# Patient Record
Sex: Male | Born: 1965 | Race: White | Hispanic: No | Marital: Married | Smoking: Current every day smoker
Health system: Southern US, Community
[De-identification: ages and names within clinical notes are randomized; demographics above are authoritative.]

## PROBLEM LIST (undated history)

## (undated) DIAGNOSIS — R0600 Dyspnea, unspecified: Secondary | ICD-10-CM

## (undated) DIAGNOSIS — R609 Edema, unspecified: Secondary | ICD-10-CM

## (undated) DIAGNOSIS — J449 Chronic obstructive pulmonary disease, unspecified: Secondary | ICD-10-CM

## (undated) DIAGNOSIS — I10 Essential (primary) hypertension: Secondary | ICD-10-CM

## (undated) DIAGNOSIS — E669 Obesity, unspecified: Secondary | ICD-10-CM

## (undated) DIAGNOSIS — I499 Cardiac arrhythmia, unspecified: Secondary | ICD-10-CM

## (undated) DIAGNOSIS — R0789 Other chest pain: Secondary | ICD-10-CM

## (undated) DIAGNOSIS — K219 Gastro-esophageal reflux disease without esophagitis: Secondary | ICD-10-CM

## (undated) DIAGNOSIS — R000 Tachycardia, unspecified: Secondary | ICD-10-CM

## (undated) DIAGNOSIS — R0601 Orthopnea: Secondary | ICD-10-CM

## (undated) DIAGNOSIS — G4730 Sleep apnea, unspecified: Secondary | ICD-10-CM

## (undated) DIAGNOSIS — R0602 Shortness of breath: Secondary | ICD-10-CM

## (undated) HISTORY — DX: Edema, unspecified: R60.9

## (undated) HISTORY — DX: Gastro-esophageal reflux disease without esophagitis: K21.9

## (undated) HISTORY — DX: Cardiac arrhythmia, unspecified: I49.9

## (undated) HISTORY — DX: Orthopnea: R06.01

## (undated) HISTORY — DX: Other chest pain: R07.89

## (undated) HISTORY — DX: Chronic obstructive pulmonary disease, unspecified: J44.9

## (undated) HISTORY — DX: Dyspnea, unspecified: R06.00

## (undated) HISTORY — DX: Tachycardia, unspecified: R00.0

## (undated) HISTORY — DX: Essential (primary) hypertension: I10

## (undated) HISTORY — DX: Shortness of breath: R06.02

## (undated) HISTORY — DX: Sleep apnea, unspecified: G47.30

## (undated) HISTORY — DX: Obesity, unspecified: E66.9

---

## 2006-04-15 ENCOUNTER — Inpatient Hospital Stay: Payer: Self-pay | Admitting: Otolaryngology

## 2007-05-10 ENCOUNTER — Emergency Department: Payer: Self-pay | Admitting: Emergency Medicine

## 2007-08-07 ENCOUNTER — Emergency Department: Payer: Self-pay | Admitting: Emergency Medicine

## 2011-04-26 ENCOUNTER — Observation Stay: Payer: Self-pay | Admitting: Internal Medicine

## 2011-04-26 DIAGNOSIS — I059 Rheumatic mitral valve disease, unspecified: Secondary | ICD-10-CM

## 2011-04-26 DIAGNOSIS — R0602 Shortness of breath: Secondary | ICD-10-CM

## 2011-04-26 LAB — COMPREHENSIVE METABOLIC PANEL
Anion Gap: 11 (ref 7–16)
Bilirubin,Total: 0.4 mg/dL (ref 0.2–1.0)
Chloride: 105 mmol/L (ref 98–107)
Co2: 26 mmol/L (ref 21–32)
Creatinine: 0.99 mg/dL (ref 0.60–1.30)
EGFR (African American): >60
EGFR (Non-African Amer.): >60
Potassium: 3.7 mmol/L (ref 3.5–5.1)
SGPT (ALT): 41 U/L

## 2011-04-26 LAB — CBC WITH DIFFERENTIAL/PLATELET
Basophil #: 0 10*3/uL (ref 0.0–0.1)
Eosinophil %: 1.3 %
MCV: 91 fL (ref 80–100)
Monocyte %: 7.8 %
Neutrophil %: 65 %
Platelet: 164 10*3/uL (ref 150–440)
RBC: 5.97 10*6/uL — ABNORMAL HIGH (ref 4.40–5.90)
RDW: 16.6 % — ABNORMAL HIGH (ref 11.5–14.5)
WBC: 12 10*3/uL — ABNORMAL HIGH (ref 3.8–10.6)

## 2011-04-26 LAB — CK TOTAL AND CKMB (NOT AT ARMC): CK-MB: 0.9 ng/mL (ref 0.5–3.6)

## 2011-04-26 LAB — DRUG SCREEN, URINE
Amphetamines, Ur Screen: NEGATIVE (ref ?–1000)
Cannabinoid 50 Ng, Ur ~~LOC~~: NEGATIVE (ref ?–50)
Methadone, Ur Screen: NEGATIVE (ref ?–300)
Opiate, Ur Screen: POSITIVE (ref ?–300)
Phencyclidine (PCP) Ur S: NEGATIVE (ref ?–25)

## 2011-04-26 LAB — TROPONIN I
Troponin-I: <0.02 ng/mL
Troponin-I: <0.02 ng/mL

## 2011-04-26 LAB — TSH: Thyroid Stimulating Horm: 2.49 u[IU]/mL

## 2011-04-26 LAB — PRO B NATRIURETIC PEPTIDE: B-Type Natriuretic Peptide: 262 pg/mL — ABNORMAL HIGH (ref 0–125)

## 2011-04-27 DIAGNOSIS — I4891 Unspecified atrial fibrillation: Secondary | ICD-10-CM

## 2011-04-27 LAB — BASIC METABOLIC PANEL
Anion Gap: 14 (ref 7–16)
BUN: 18 mg/dL (ref 7–18)
Calcium, Total: 8.9 mg/dL (ref 8.5–10.1)
Chloride: 95 mmol/L — ABNORMAL LOW (ref 98–107)
EGFR (African American): >60
EGFR (Non-African Amer.): >60
Glucose: 268 mg/dL — ABNORMAL HIGH (ref 65–99)
Osmolality: 283 (ref 275–301)
Potassium: 5 mmol/L (ref 3.5–5.1)

## 2011-04-27 LAB — CBC WITH DIFFERENTIAL/PLATELET
Basophil %: 0 %
Eosinophil %: 0.1 %
HCT: 53 % — ABNORMAL HIGH (ref 40.0–52.0)
HGB: 17.4 g/dL (ref 13.0–18.0)
Lymphocyte #: 1.4 10*3/uL (ref 1.0–3.6)
Monocyte %: 1.4 %
Neutrophil #: 12.3 10*3/uL — ABNORMAL HIGH (ref 1.4–6.5)
Neutrophil %: 88.7 %
Platelet: 142 10*3/uL — ABNORMAL LOW (ref 150–440)
RBC: 5.81 10*6/uL (ref 4.40–5.90)
RDW: 16.6 % — ABNORMAL HIGH (ref 11.5–14.5)
WBC: 13.9 10*3/uL — ABNORMAL HIGH (ref 3.8–10.6)

## 2011-04-27 LAB — CK TOTAL AND CKMB (NOT AT ARMC)
CK, Total: 62 U/L (ref 35–232)
CK-MB: 0.9 ng/mL (ref 0.5–3.6)

## 2011-04-27 LAB — LIPID PANEL
Cholesterol: 135 mg/dL (ref 0–200)
HDL Cholesterol: 52 mg/dL (ref 40–60)
Ldl Cholesterol, Calc: 71 mg/dL (ref 0–100)
VLDL Cholesterol, Calc: 12 mg/dL (ref 5–40)

## 2011-04-27 LAB — MAGNESIUM: Magnesium: 1.6 mg/dL — ABNORMAL LOW

## 2011-04-27 LAB — TROPONIN I: Troponin-I: <0.02 ng/mL

## 2011-05-18 ENCOUNTER — Encounter: Payer: Self-pay | Admitting: *Deleted

## 2011-05-18 ENCOUNTER — Encounter: Payer: Self-pay | Admitting: Cardiovascular Disease

## 2011-05-26 ENCOUNTER — Encounter: Payer: Self-pay | Admitting: Cardiovascular Disease

## 2013-12-01 ENCOUNTER — Other Ambulatory Visit: Payer: Self-pay | Admitting: Physician Assistant

## 2013-12-01 ENCOUNTER — Inpatient Hospital Stay: Payer: Self-pay | Admitting: Internal Medicine

## 2013-12-01 DIAGNOSIS — I4891 Unspecified atrial fibrillation: Secondary | ICD-10-CM

## 2013-12-01 DIAGNOSIS — N179 Acute kidney failure, unspecified: Secondary | ICD-10-CM

## 2013-12-01 DIAGNOSIS — R7989 Other specified abnormal findings of blood chemistry: Secondary | ICD-10-CM

## 2013-12-01 DIAGNOSIS — J9601 Acute respiratory failure with hypoxia: Secondary | ICD-10-CM

## 2013-12-01 DIAGNOSIS — I517 Cardiomegaly: Secondary | ICD-10-CM

## 2013-12-01 LAB — COMPREHENSIVE METABOLIC PANEL
ALK PHOS: 67 U/L
ALT: 651 U/L — AB
ANION GAP: 8 (ref 7–16)
Albumin: 3 g/dL — ABNORMAL LOW (ref 3.4–5.0)
BUN: 53 mg/dL — ABNORMAL HIGH (ref 7–18)
Bilirubin,Total: 0.5 mg/dL (ref 0.2–1.0)
CHLORIDE: 87 mmol/L — AB (ref 98–107)
Calcium, Total: 8.4 mg/dL — ABNORMAL LOW (ref 8.5–10.1)
Co2: 32 mmol/L (ref 21–32)
Creatinine: 2.7 mg/dL — ABNORMAL HIGH (ref 0.60–1.30)
EGFR (Non-African Amer.): 27 — ABNORMAL LOW
GFR CALC AF AMER: 32 — AB
GLUCOSE: 197 mg/dL — AB (ref 65–99)
Osmolality: 275 (ref 275–301)
Potassium: 5.9 mmol/L — ABNORMAL HIGH (ref 3.5–5.1)
SGOT(AST): 947 U/L — ABNORMAL HIGH (ref 15–37)
SODIUM: 127 mmol/L — AB (ref 136–145)
TOTAL PROTEIN: 7.2 g/dL (ref 6.4–8.2)

## 2013-12-01 LAB — CBC
HCT: 46.9 % (ref 40.0–52.0)
HGB: 14.7 g/dL (ref 13.0–18.0)
MCH: 27.1 pg (ref 26.0–34.0)
MCHC: 31.3 g/dL — ABNORMAL LOW (ref 32.0–36.0)
MCV: 87 fL (ref 80–100)
PLATELETS: 129 10*3/uL — AB (ref 150–440)
RBC: 5.41 10*6/uL (ref 4.40–5.90)
RDW: 17.8 % — ABNORMAL HIGH (ref 11.5–14.5)
WBC: 10.6 10*3/uL (ref 3.8–10.6)

## 2013-12-01 LAB — PROTIME-INR
INR: 5.3 — AB
Prothrombin Time: 46.9 secs — ABNORMAL HIGH (ref 11.5–14.7)

## 2013-12-01 LAB — TROPONIN I: TROPONIN-I: 0.22 ng/mL — AB

## 2013-12-01 LAB — CK TOTAL AND CKMB (NOT AT ARMC)
CK, Total: 461 U/L — ABNORMAL HIGH
CK-MB: 4.1 ng/mL — AB (ref 0.5–3.6)

## 2013-12-01 LAB — PRO B NATRIURETIC PEPTIDE: B-TYPE NATIURETIC PEPTID: 8433 pg/mL — AB (ref 0–125)

## 2013-12-02 LAB — CBC WITH DIFFERENTIAL/PLATELET
Basophil #: 0 10*3/uL (ref 0.0–0.1)
Basophil %: 0.1 %
EOS ABS: 0 10*3/uL (ref 0.0–0.7)
Eosinophil %: 0 %
HCT: 44.1 % (ref 40.0–52.0)
HGB: 14 g/dL (ref 13.0–18.0)
LYMPHS ABS: 0.6 10*3/uL — AB (ref 1.0–3.6)
Lymphocyte %: 6.2 %
MCH: 27.2 pg (ref 26.0–34.0)
MCHC: 31.6 g/dL — ABNORMAL LOW (ref 32.0–36.0)
MCV: 86 fL (ref 80–100)
MONO ABS: 0.6 x10 3/mm (ref 0.2–1.0)
Monocyte %: 6 %
NEUTROS PCT: 87.7 %
Neutrophil #: 9.2 10*3/uL — ABNORMAL HIGH (ref 1.4–6.5)
Platelet: 116 10*3/uL — ABNORMAL LOW (ref 150–440)
RBC: 5.13 10*6/uL (ref 4.40–5.90)
RDW: 17.7 % — ABNORMAL HIGH (ref 11.5–14.5)
WBC: 10.4 10*3/uL (ref 3.8–10.6)

## 2013-12-02 LAB — BASIC METABOLIC PANEL
ANION GAP: 7 (ref 7–16)
BUN: 60 mg/dL — AB (ref 7–18)
CALCIUM: 8.4 mg/dL — AB (ref 8.5–10.1)
CHLORIDE: 89 mmol/L — AB (ref 98–107)
CO2: 30 mmol/L (ref 21–32)
Creatinine: 2.18 mg/dL — ABNORMAL HIGH (ref 0.60–1.30)
EGFR (Non-African Amer.): 34 — ABNORMAL LOW
GFR CALC AF AMER: 41 — AB
Glucose: 359 mg/dL — ABNORMAL HIGH (ref 65–99)
OSMOLALITY: 285 (ref 275–301)
Potassium: 5.3 mmol/L — ABNORMAL HIGH (ref 3.5–5.1)
SODIUM: 126 mmol/L — AB (ref 136–145)

## 2013-12-02 LAB — HEPATIC FUNCTION PANEL A (ARMC)
ALBUMIN: 2.7 g/dL — AB (ref 3.4–5.0)
AST: 832 U/L — AB (ref 15–37)
Alkaline Phosphatase: 67 U/L
BILIRUBIN TOTAL: 0.4 mg/dL (ref 0.2–1.0)
Bilirubin, Direct: 0.2 mg/dL (ref 0.0–0.2)
SGPT (ALT): 856 U/L — ABNORMAL HIGH
Total Protein: 6.7 g/dL (ref 6.4–8.2)

## 2013-12-02 LAB — TROPONIN I
Troponin-I: 0.22 ng/mL — ABNORMAL HIGH
Troponin-I: 0.18 ng/mL — ABNORMAL HIGH

## 2013-12-02 LAB — CK-MB
CK-MB: 3.1 ng/mL (ref 0.5–3.6)
CK-MB: 2.2 ng/mL (ref 0.5–3.6)

## 2013-12-02 LAB — SODIUM: Sodium: 126 mmol/L — ABNORMAL LOW (ref 136–145)

## 2013-12-03 LAB — BASIC METABOLIC PANEL
Anion Gap: 10 (ref 7–16)
BUN: 67 mg/dL — AB (ref 7–18)
CALCIUM: 8.8 mg/dL (ref 8.5–10.1)
CO2: 31 mmol/L (ref 21–32)
CREATININE: 2.15 mg/dL — AB (ref 0.60–1.30)
Chloride: 90 mmol/L — ABNORMAL LOW (ref 98–107)
EGFR (African American): 42 — ABNORMAL LOW
EGFR (Non-African Amer.): 35 — ABNORMAL LOW
Glucose: 328 mg/dL — ABNORMAL HIGH (ref 65–99)
Osmolality: 295 (ref 275–301)
POTASSIUM: 4.5 mmol/L (ref 3.5–5.1)
Sodium: 131 mmol/L — ABNORMAL LOW (ref 136–145)

## 2013-12-03 LAB — PROTIME-INR
INR: 3
Prothrombin Time: 30.2 secs — ABNORMAL HIGH (ref 11.5–14.7)

## 2013-12-03 LAB — HEPATIC FUNCTION PANEL A (ARMC)
ALT: 723 U/L — AB
Albumin: 2.8 g/dL — ABNORMAL LOW (ref 3.4–5.0)
Alkaline Phosphatase: 69 U/L
Bilirubin, Direct: 0.2 mg/dL (ref 0.0–0.2)
Bilirubin,Total: 0.5 mg/dL (ref 0.2–1.0)
SGOT(AST): 293 U/L — ABNORMAL HIGH (ref 15–37)
Total Protein: 6.8 g/dL (ref 6.4–8.2)

## 2013-12-03 LAB — ACETAMINOPHEN LEVEL

## 2013-12-03 LAB — SODIUM: Sodium: 130 mmol/L — ABNORMAL LOW (ref 136–145)

## 2013-12-04 DIAGNOSIS — I5031 Acute diastolic (congestive) heart failure: Secondary | ICD-10-CM

## 2013-12-04 LAB — BASIC METABOLIC PANEL
Anion Gap: 7 (ref 7–16)
BUN: 62 mg/dL — ABNORMAL HIGH (ref 7–18)
CREATININE: 2.08 mg/dL — AB (ref 0.60–1.30)
Calcium, Total: 9.4 mg/dL (ref 8.5–10.1)
Chloride: 95 mmol/L — ABNORMAL LOW (ref 98–107)
Co2: 32 mmol/L (ref 21–32)
EGFR (Non-African Amer.): 36 — ABNORMAL LOW
GFR CALC AF AMER: 44 — AB
GLUCOSE: 428 mg/dL — AB (ref 65–99)
OSMOLALITY: 304 (ref 275–301)
Potassium: 4.9 mmol/L (ref 3.5–5.1)
Sodium: 134 mmol/L — ABNORMAL LOW (ref 136–145)

## 2013-12-04 LAB — HEPATIC FUNCTION PANEL A (ARMC)
Albumin: 2.8 g/dL — ABNORMAL LOW (ref 3.4–5.0)
Alkaline Phosphatase: 72 U/L
Bilirubin, Direct: 0.2 mg/dL (ref 0.0–0.2)
Bilirubin,Total: 0.4 mg/dL (ref 0.2–1.0)
SGOT(AST): 199 U/L — ABNORMAL HIGH (ref 15–37)
SGPT (ALT): 717 U/L — ABNORMAL HIGH
TOTAL PROTEIN: 7 g/dL (ref 6.4–8.2)

## 2013-12-04 LAB — PROTIME-INR
INR: 2.6
Prothrombin Time: 26.9 secs — ABNORMAL HIGH (ref 11.5–14.7)

## 2013-12-05 LAB — COMPREHENSIVE METABOLIC PANEL
Albumin: 2.8 g/dL — ABNORMAL LOW (ref 3.4–5.0)
Alkaline Phosphatase: 73 U/L
Anion Gap: 8 (ref 7–16)
BUN: 75 mg/dL — ABNORMAL HIGH (ref 7–18)
Bilirubin,Total: 0.5 mg/dL (ref 0.2–1.0)
Calcium, Total: 9.2 mg/dL (ref 8.5–10.1)
Chloride: 96 mmol/L — ABNORMAL LOW (ref 98–107)
Co2: 36 mmol/L — ABNORMAL HIGH (ref 21–32)
Creatinine: 1.98 mg/dL — ABNORMAL HIGH (ref 0.60–1.30)
EGFR (African American): 46 — ABNORMAL LOW
EGFR (Non-African Amer.): 38 — ABNORMAL LOW
Glucose: 410 mg/dL — ABNORMAL HIGH (ref 65–99)
Osmolality: 319 (ref 275–301)
Potassium: 4.2 mmol/L (ref 3.5–5.1)
SGOT(AST): 122 U/L — ABNORMAL HIGH (ref 15–37)
SGPT (ALT): 606 U/L — ABNORMAL HIGH
Sodium: 140 mmol/L (ref 136–145)
Total Protein: 6.9 g/dL (ref 6.4–8.2)

## 2013-12-05 LAB — PROTIME-INR
INR: 2.7
PROTHROMBIN TIME: 27.9 s — AB (ref 11.5–14.7)

## 2013-12-06 LAB — CULTURE, BLOOD (SINGLE)

## 2014-02-16 DEATH — deceased

## 2014-06-09 NOTE — H&P (Signed)
PATIENT NAME:  Javier Brooks, Javier Brooks MR#:  [id number] DATE OF BIRTH:  [birth year]  DATE OF ADMISSION:  12/01/2013  ADMITTING PHYSICIAN: Enid Baas, MD   PRIMARY CARE PHYSICIAN: [facility]   CHIEF COMPLAINT: Difficulty breathing.   HISTORY OF PRESENT ILLNESS: Mr. Maffei is a 49 year old,  morbidly obese Caucasian male with a past medical history significant for obstructive sleep apnea on CPAP, COPD on 2.5 L home oxygen, ongoing smoking, diabetes, hypertension, atrial fibrillation, chronic pain syndrome, and congestive heart failure, who presents to the hospital secondary to worsening shortness of breath that started 3 days ago. The patient is on BiPAP at this time. Quite lethargic. Most of the history is obtained from his wife at bedside.   According to wife, the patient has been doing reasonably well up until 3 days ago when he started to have cough, congestion, and upper respiratory tract infection symptoms. His breathing got progressively worse that he saw his PCP a couple of days ago. They recommended hospital admission; however, the patient refused and wanted to try outpatient management. He was started on prednisone and azithromycin, but his breathing got worse this morning. Wife said that he has been sleeping since last night, not waking up this morning, his face swollen and purple, with his pursed lips and breathing was difficult. He was barely arousable, so she got concerned and called EMS. His saturations were 50% when the EMS got there. He was placed on CPAP and brought over to the hospital.   His ABGs looked extremely bad with a pH of 7.19, pCO2 of 79 when he came in. However, since he was arousable, BiPAP was tried and his repeat ABGs showed pH of 7.27 with improved pCO2 of 59. He is on 50% FIO2 and had saturations greater than 90% at this time. He is being admitted for acute hypercarbic hypoxic respiratory failure secondary to COPD and CHF exacerbation.   The patient states he always  has bilaterally leg edema and has been steady. His fluid intake is quite more according to the wife; he is not compliant with restricted fluid intake. He does take his Javier Brooks medications as per schedule, according to wife. No sick contacts. No recent travel. Continues to smoke.   PAST MEDICAL HISTORY:  1.  Morbid obesity.  2.  Obstructive sleep apnea with CPAP at bedtime.  3.  Chronic respiratory failure secondary to COPD, on 2.4 L home oxygen.  4.  Ongoing smoking.  5.  Diastolic congestive heart failure.  6.  Diabetes mellitus.  7.  Hypertension.  8.  Hyperlipidemia.  9.  Atrial fibrillation, on Coumadin.  10.  Anxiety and depression.  11.  Chronic pain.   PAST SURGICAL HISTORY: None.   ALLERGIES:  1.  ACE INHIBITORS CAUSE ANGIOEDEMA.  2.  ALLERGIC TO RIVAROXABAN.   3.  INTOLERANT TO PRADAXA.   CURRENT HOME MEDICATIONS:  1.  Albuterol inhaler 2 puffs q. 6 hours p.r.n. for wheezing.  2.  Xanax 2 mg 4 times a day as needed for anxiety.  3.  Amitriptyline 75 mg by mouth nightly.  4.  Azithromycin 500 mg p.o. daily, started on 11/29/2013.  5.  BuSpar 15 mg p.o. 3 times a day.  6.  Zyrtec 10 mg p.o. daily.  7.  Celexa 40 mg p.o. daily.  8.  Diclofenac 1% Voltaren gel 4 g 4 times a day as needed for arthritis pain.  9.  Diltiazem 240 mg tablet, 2 tablets once a day.  10.  Epinephrine  EpiPen injection as needed.  11.  Nexium 40 mg p.o. daily.  12.  Flonase 50 mcg inhalation twice a day.  13.  Advair 500/50 mg 1 puff b.i.d.  14.  Lasix 80 mg by mouth twice a day.  15.  Gabapentin 800 mg by mouth 3 times a day.  16.  Narco 10/325 mg 1 tablet every 4 hours as needed for pain.  17.  Lantus 30 unit at bedtime nightly.  18.  Atrovent 0.2% nebulizer 4 times a day as needed.  19.  Lidoderm patch to affected on the back once a day.  20.  Losartan 50 mg by mouth daily.  21.  Metformin 1000 mg p.o. b.i.d.  22.  Oxycodone 10 mg by mouth twice a day as needed for pain.  23.  MiraLax powder  for constipation.  24.  Prednisone taper given for 24 days.  25.  Ranitidine 150 mg p.o. b.i.d.  26.  Crestor 40 mg by mouth daily.  27.  Tramadol 150 mg 2 tablets every 8 hours as needed for pain.  28.  Coumadin 10 mg p.o. daily.   SOCIAL HISTORY: Lives at home with wife. Continues to smoke about 1 pack per day. No alcohol use.   FAMILY HISTORY: Significant, for diabetes and heart disease in the family.   REVIEW OF SYSTEMS: Difficult to be obtained secondary to the patient's respiratory status and also mental status, as he is on BiPAP.   PHYSICAL EXAMINATION:  VITAL SIGNS: Temperature 98 degrees Fahrenheit, pulse 107, respirations 28, blood pressure 143/83, pulse oximetry 85% on nonrebreather mask.  GENERAL: Heavily built, well nourished male sitting in bed in acute respiratory distress. He had to bend forward to help him breathe better.  HEENT: Normocephalic, atraumatic. Pupils equal, round, reacting to light. Anicteric sclerae. Extraocular movements intact. BiPAP mask on, but mouth looks okay, without any obvious erythema, exudates or masses.  NECK: Supple. No thyromegaly, or JVD. The patient is using accessory muscles to breathe when he initially came in.  LUNGS: Diffuse crackles and also wheezing bilaterally, both lobes, anteriorly and posteriorly.   CARDIOVASCULAR: S1, S2. Irregular rhythm and normal rate. No murmurs, rubs, or gallops.  ABDOMEN: Obese, nontender, nondistended. No hepatosplenomegaly. Normal bowel sounds.  EXTREMITIES: With chronic skin changes from chronic lymphedema. The patient has 3 to 4+ chronic edema with mild erythema, which according to him is chronic, and scaling noted on both lower extremities.  SKIN: No acne, rash or lesions.  LYMPHATICS: No cervical lymphadenopathy.  NEUROLOGIC: The patient is arousable. No obvious cranial nerve deficits noted. Strength is 5/5 in all 4 extremities. Sensation seems to be intact.  PSYCHOLOGIC: The patient is alert and oriented.    LABORATORY DATA: WBC 10.6, hemoglobin 14.7, hematocrit 46.9, platelet count 129,000.  Sodium 127, potassium 5.9, chloride 87, bicarbonate 32, BUN 53, creatinine 2.7, glucose of 197, calcium of 8.4. ALT 651, AST 947, alkaline phosphatase 67, total bilirubin 3.0 and albumin of 3.0. INR is 5.3. Troponin 0.22. CK 461, CK-MB 4.1. BNP 8433.   ABG initial 7.19 pH, pCO2 of 79, pO2 of 450 and 100% FiO2, bicarbonate 30. ABG repeat on BiPAP is 7.27 pH, pCO2 of 59, pO2 of 57, bicarbonate 27 and saturations of 92% on 50% FiO2.   Chest x-ray showing bilateral interstitial opacities, increased from prior study, reflecting interstitial edema and infectious pneumonitis.   ASSESSMENT AND PLAN: This is a 49 year old obese male with a history of COPD, on home oxygen, CHF, atrial fibrillation on Coumadin,  smoking, brought in from home for acute respiratory failure.  1.  Acute hypoxic hypercarbic respiratory failure secondary to COPD and CHF exacerbation, responding well to BiPAP. Will admit to CCU. Repeat ABG ordered for this evening. Started IV Lasix, fluid restriction, nebulizer treatment, Solu-Medrol and inhalers. Started Rocephin and azithromycin empirically. Cardiology and pulmonary have been consulted. Followup an echocardiogram.  2.  Acute renal failure with hypokalemia. Hold nephrotoxics, metformin and losartan have been held.  Lasix given for now, likely potassium will come down with this Lasix; will recheck. Likely acute tubular necrosis and cardiorenal syndrome. Diurese and monitor. Nephrology consulted, and a renal ultrasound ordered. Baseline creatinine seems to be 1.1 from last admission.  3.  Elevated LFTs. Congestive hepatopathy.  Diurese and monitor. If worsening, then we will have to order an abdominal ultrasound.  4.  Atrial fibrillation. Hold Coumadin, as INR is high. Continue Cardizem p.o. for now.  5.  Elevated troponin, likely from demand ischemia. Re cycle troponins and monitor. The patient is  already on Coumadin. INR is 5.3, so we will hold off on any IV heparin at this point.  6.  Tobacco use disorder. Counseled against smoking for 3 minutes, starting on nicotine patch.  7.  Deep vein thrombosis prophylaxis, supratherapeutic on Coumadin at this point.   CODE STATUS: Full Code.   CRITICAL CARE TIME SPENT ON ADMISSION: 60 minutes     ____________________________  Enid Baasadhika Ameer Sanden, MD rk:MT D: 12/01/2013 13:06:17 ET T: 12/01/2013 13:30:15 ET JOB#: [id number]  cc: Enid Baasadhika Corliss Lamartina, MD, <Dictator> [facility] Enid BaasADHIKA Zeric Baranowski MD ELECTRONICALLY SIGNED 12/01/2013 14:37

## 2014-06-09 NOTE — Discharge Summary (Signed)
PATIENT NAME:  Javier Brooks, Javier Brooks MR#:  [id number] DATE OF BIRTH:  12-12-1965  DATE OF ADMISSION:  12/01/2013 DATE OF DISCHARGE:  12/05/2013  PRIMARY CARE PHYSICIAN:  Patient states it is Milderd Meager, MD   FINAL DIAGNOSES: 1.  Acute respiratory failure, hypoxic and hypercarbic, now chronic.  2.  Chronic obstructive pulmonary disease exacerbation.  3.  Acute diastolic congestive heart failure.  4.  Possible pneumonia.  5.  Acute renal failure on chronic kidney disease.  6.  Hyponatremia.  7.  Atrial fibrillation.  8.  Diabetes.  9.  Chronic pain.  10. Increase liver function tests.   MEDICATIONS ON DISCHARGE: Include BuSpar 15 mg 3 times a day, Celexa 40 mg daily, Advair Diskus 500/50 one puff twice a day, albuterol 90 mcg/inhalation 2 puffs every 6 hours as needed, Zyrtec 10 mg daily, diltiazem 240 mg 2 capsules extended-release daily, Nexium 40 mg daily, Flonase nasal spray 50 mcg/spray 1 spray twice a day, Lasix 80 mg twice a day, insulin glargine 30 units subcutaneous injection at bedtime, ipratropium nebulizer 1 mL 4 times a day as needed, oxycodone 10 mg twice a day as needed, MiraLax 17 grams daily. Ranitidine 150 mg twice a day, amitriptyline 75 mg at bedtime, Coumadin 7.5 mg at 5:00 p.m., gabapentin 400 mg 2 capsules twice a day, Xanax 1 mg every eight hours as needed for anxiety prednisone taper 5 mg 3 tablets day 1, 2 tablets day 2, 1 tablet day 3, Humulin R sliding scale, nicotine patch 21 mg per chest wall daily, Ceftin 500 mg twice a day for six days. Stop taking losartan, tramadol, metformin and Zithromax, diclofenac, EpiPen, Percocet, lidocaine and Crestor.   HOME HEALTH: Yes. Physical therapy nurse, skilled assessment, help with strength.  HOME OXYGEN: Yes, 2 liters nasal cannula 24/7.   DIET: Low sodium diet, regular consistency.   ACTIVITY: As tolerated. Follow-up in 1 to 2 weeks, Dr. Milderd Meager.   HOSPITAL COURSE: The patient was admitted 12/01/2013 and discharged home on  12/05/2013 after he told me he is leaving today regardless of whether I discharge him are not. The patient came in on October 16 with difficulty breathing, was started on BiPAP for acute hypoxic hypercarbic respiratory failure secondary to chronic obstructive pulmonary disease and congestive heart failure exacerbation, was started on IV Lasix, nebulizers, Solu-Medrol, Rocephin, and Zithromax. The patient had acute renal failure with hypokalemia. The patient was started on IV Lasix and watched closely. For the patient's elevated LFTs, likely secondary to congestive hepatopathy, hepatitis profiles were ordered and ultrasound ordered for atrial fibrillation. The initial Coumadin level was high and Coumadin was held; elevated troponin from demand ischemia.   LABORATORY AND RADIOLOGICAL DATA DURING THE HOSPITAL COURSE: EKG showed atrial fibrillation, rapid ventricular response, right axis deviation, low voltage, septal infarct. Troponin 0.22, INR 5.3. Glucose 197, BUN 53, creatinine 2.7, sodium 127, potassium 5.9, chloride 87, CO2 of 32, calcium 8.4. Liver function tests: ALT 651, AST 947, albumin 3.0, total protein 7.2. White blood cell count 10.6, hemoglobin and hematocrit 14.7 and 46.9, platelet count of 129, BNP 8433.   ABG showed a pH of 7.19, pCO2 of 79, pO2 of 450 that was on 100% oxygen, bicarbonate 30.2. Blood cultures were negative. Chest x-ray:  Bilateral interstitial opacities, mildly increased from prior study may reflect interstitial edema, or infectious pneumonitis. Repeat ABG showed a pH of 7.27, pCO2 of 59, pO2 of 57; that was on 50% FiO2, bicarbonate 27.1. Echocardiogram:  Rhythm atrial fibrillation, ejection fraction  55% to 60%, dilated atrium. Ultrasound of the kidney:  Normal degree of bladder distention. Normal renal ultrasound. Hepatitis A, B, and C panel negative. Last chemistry upon discharge, showed a creatinine of 1.98, sodium 140, potassium 4.2, ALT 606, AST 122, albumin 2.8. Ultrasound of  the abdomen showed probable small gallbladder polyp, questionable subtle nodular contour of the liver. Cannot exclude cirrhosis. No definite intrahepatic focal abnormalities. INR upon discharge 2.7.   HOSPITAL COURSE PER PROBLEM LIST:  1.  For the patient's acute respiratory failure, hypoxic and hypercarbic, now chronic, the patient told me on 12/05/2013, that he is going home today whether I discharge him or not. I had the care manager speak with the oxygen company to set up a Trilogy at home to avoid hospitalizations. The patient does have a CPAP at home, which is not working for him since he came in horrible shape. The patient compliance could be an issue here. The patient was not moving air when he came in.  Now he is moving better air. Lungs are clear with better breath sounds. He was treated for both heart failure and chronic obstructive pulmonary disease exacerbation.  2.  For chronic obstructive pulmonary disease exacerbation, the patient was given high-dose Solu-Medrol, switched over to do a quick prednisone taper upon discharge because his lungs were clear and his sugars were elevated. He has his normal inhalers at home.  3.  For his acute diastolic congestive heart failure, he was initially diuresed with 80 mg twice a day of IV Lasix. I actually gave him a dose of Samsca for hyponatremia which also helped out with diuresis. He was switched back to his normal 80 mg p.o. b.i.d. upon going home.  4.  Possible pneumonia. He was given Rocephin and Zithromax here.  He finished the course of Zithromax during the hospital course. We will give Ceftin upon going home for completion of course.  5.  Acute renal failure on chronic kidney disease. Follow up as outpatient as needed.  Here, normal renal ultrasound. Creatinine did improve with diuresis.  6.  Hyponatremia.  Sodium was in the 120s when he came in; I gave him one dose of Samsca.  That had improved upon discharge. Sodium normal range 140.  7.  Atrial  fibrillation. The patient had coagulopathy with Coumadin upon presentation. Upon going home, INR 2.7. I decreased his dose of Coumadin to 7.5 mg daily.  8.  Diabetes. Sugars out of control with his being on Solu-Medrol. Go back to his Lantus at home. I ordered short-acting insulin for sliding scale.  Metformin contraindicated secondary to chronic kidney disease.  9.  Chronic pain. He did have some shaking episodes I am not sure whether this was secondary to his high CO2 or pain medication withdrawal. I did start back his oxycodone and that seemed to improve.  10. Increased liver function tests. Could be secondary to congestive heart failure. Could also be secondary to cirrhosis. Hepatitis profiles negative. Ultrasound of the abdomen showed possible cirrhosis of the liver. Either way, it Crestor is not a good medication for him. Percocet with the Tylenol is not a good medication for him. Follow up liver function tests as outpatient.   TIME SPENT ON DISCHARGE: 35 minutes.   ____________________________ Herschell Dimesichard J. Renae GlossWieting, MD rjw:jp D: 12/05/2013 14:22:26 ET T: 12/05/2013 16:47:39 ET JOB#: [id number]  cc: Herschell Dimesichard J. Renae GlossWieting, MD, <Dictator> Milderd Meagerheryl Lee, MD  Salley ScarletICHARD J Boston Cookson MD ELECTRONICALLY SIGNED 12/07/2013 13:25

## 2014-06-09 NOTE — Consult Note (Signed)
General Aspect PCP: [facility] Cardiologist: [facility] __________________  49 year old male with history of persistent a-fib, chronic diastolic CHF (EF 13-08% on echo 11/01/2012 via [place] at [facility]), COPD on 2.5 L at home, ongoing smoking, morbid obesity, OSA on CPAP, HTN, & DM2 who presented to [facility] today with worsening shortness of breath that started 3 days ago. __________________  PMH: 1. Persistent a-fib 2. Chronic diastolic CHF 3. COPD on 2.5 L at home 4. Ongoing smoking 5. Morbid obesity 6. OSA on CPAP 7. HTN 8. DM2 9. Venous insufficiency ___________________   Present Illness 49 year old male with the above problem list who presented to [facility] today with worsening SOB x 3 days.  Cardiology was consulted for acute on chronic diastolic CHF.  morbidly obese  male with a PMH of OSA, on CPAP, COPD on 2.5 L home oxygen, active smoking, diabetes, hypertension, atrial fibrillation, chronic pain syndrome, and diastolic congestive heart failure, who presents to the hospital secondary to worsening shortness of breath that started 3 days ago. . Most of the history from the notes. No family by the bedside.   the patient was stable up until 3 days ago when he started to have cough, congestion, and upper respiratory tract infection symptoms. His breathing got progressively worse.  he saw his PCP a couple of days ago. They recommended hospital admission; however, the patient refused and wanted to try outpatient management. He was started on prednisone and azithromycin, but his breathing got worse this morning.  he was sleeping since last night, and did not wake in the morning, his face was swollen and purple, pursed lips and breathing was difficult. He was barely arousable and she called EMS. His saturations were 50% when the EMS got there. He was placed on CPAP and brought over to the hospital.   His ABGs looked extremely bad with a pH of 7.19, pCO2 of 79 when he came in. However,  since he was arousable, BiPAP was tried and his repeat ABGs showed pH of 7.27 with improved pCO2 of 59. He is on 50% FIO2 and had saturations greater than 90% at this time. He is being admitted for acute hypercarbic hypoxic respiratory failure secondary to COPD and CHF exacerbation.    he always has bilaterally leg edema.  he is not compliant with restricted fluid intake. He does take his other medications as per schedule, according to wife. No sick contacts. No recent travel. Continues to smoke.   SOCIAL HISTORY: Lives at home with wife. Continues to smoke about 1 pack per day. No alcohol use.   FAMILY HISTORY: Significant, for diabetes and heart disease in the family.   Physical Exam:  GEN well developed, obese, critically ill appearing   HEENT PERRL   NECK supple   RESP on bipap   CARD Regular rate and rhythm  No murmur   ABD soft   LYMPH negative neck   EXTR positive edema, trace   SKIN normal to palpation   NEURO unable to test   [facility] stuporous   Review of Systems:  Review of Systems: All other systems were reviewed and found to be negative   ROS Pt not able to provide ROS   Medications/Allergies Reviewed Medications/Allergies reviewed     GERD - Esophageal Reflux:    Atrial Fibrillation:    Hypertension:    Asthma:    COPD:    Diabetes Mellitus, Type II (NIDD):        Admit  Diagnosis:   ACUTE RESPIRATORY FAILURE: Onset Date: 01-Dec-2013, Status: Active, Description: ACUTE RESPIRATORY FAILURE  Home Medications: Medication Instructions Status  metFORMIN 1000 mg oral tablet 1 tab(s) orally 2 times a day Active  gabapentin 800 mg oral tablet 1 tab(s) orally 3 times a day Active  albuterol 90 mcg/inh inhalation aerosol 2 puff(s) inhaled every 6 hours, As Needed Active  losartan 50 mg oral tablet 1 tab(s) orally once a day Active  tramadol 50 mg oral tablet 2 tab(s) orally 3 times a day, As Needed Active  busPIRone 15 mg oral tablet 1 tab(s) orally 3  times a day Active  citalopram 40 mg oral tablet 1 tab(s) orally once a day Active  Advair Diskus 500 mcg-50 mcg inhalation powder 1 puff(s) inhaled 2 times a day Active  amitriptyline 75 mg oral tablet 1 tab(s) orally once a day (at bedtime) Active  ALPRAZolam 2 mg oral tablet 1 tab(s) orally 4 times a day, As Needed - for Anxiety, Nervousness Active  azithromycin 500 mg oral tablet 1 tab(s) orally once a day for 5 days Active  diclofenac topical 1% topical gel Apply topically to affected area 4 times a day, As Needed Active  cetirizine 10 mg oral tablet 1 tab(s) orally once a day Active  diltiazem 240 mg/24 hours oral capsule, extended release 2 cap(s) orally once a day Active  EpiPen 2-Pak 0.3 mg injectable kit 0.3 milliliter(s) intramuscular once, As Needed Active  esomeprazole 40 mg oral delayed release capsule 1 cap(s) orally once a day Active  fluticasone nasal 50 mcg/inh nasal spray 1 spray(s) nasal 2 times a day Active  furosemide 80 mg oral tablet 1 tab(s) orally 2 times a day Active  acetaminophen-HYDROcodone 325 mg-10 mg oral tablet 1 tab(s) orally every 4 hours, As Needed Active  insulin glargine 100 units/mL subcutaneous solution 30 unit(s) subcutaneous once a day (at bedtime) Active  ipratropium 500 mcg/2.5 mL inhalation solution 1 milliliter(s) inhaled 4 times a day, As Needed Active  lidocaine topical 5% topical film Apply topically to affected area once a day Active  oxyCODONE 10 mg oral tablet 1 tab(s) orally 2 times a day, As Needed Active  polyethylene glycol 3350 - oral powder for reconstitution 17 gram(s) orally once a day Active  predniSONE 20 mg oral tablet 1 dose(s) orally once a day per taper $RemoveB'70mg'ZtCppUzb$  for 5 days, $Remov'60mg'AWMyCU$  for 3 days, $Remov'50mg'TpFhwD$  for 3 days, $Remov'40mg'cWSgiv$  for 3 days, $Remov'30mg'lOiJiJ$  for 3 days, $Remov'20mg'yLZkfl$  for 3 days, $Remov'10mg'eYhtkN$  for 3 days Active  ranitidine 150 mg oral tablet 1 tab(s) orally 2 times a day Active  rosuvastatin 40 mg oral tablet 1 tab(s) orally once a day (at bedtime) Active   warfarin 5 mg oral tablet 2 tab(s) orally once a day Active   Lab Results:  Hepatic:  16-Oct-15 11:14   Bilirubin, Total 0.5  Alkaline Phosphatase 67 (46-116 NOTE: New Reference Range 09/05/13)  SGPT (ALT)  651 (14-63 NOTE: New Reference Range 09/05/13)  SGOT (AST)  947  Total Protein, Serum 7.2  Albumin, Serum  3.0  Routine Chem:  16-Oct-15 11:14   Result Comment INR - RESULTS VERIFIED BY REPEAT TESTING.  - NOTIFIED OF CRITICAL VALUE  - HP TO ELIZABETH BEAR$RemoveBeforeDE'@1148'MtujLofCCFOXSZz$  ON 12/01/13  - READ-BACK PROCESS PERFORMED.  Result(s) reported on 01 Dec 2013 at 11:49AM.  B-Type Natriuretic Peptide [facility]  [phone] (Result(s) reported on 01 Dec 2013 at 11:58AM.)  Glucose, Serum  197  BUN  53  Creatinine (comp)  2.70  Sodium, Serum  127  Potassium, Serum  5.9  Chloride, Serum  87  CO2, Serum 32  Calcium (Total), Serum  8.4  Osmolality (calc) 275  eGFR (African American)  32  eGFR (Non-African American)  27 (eGFR values <70m/min/1.73 m2 may be an indication of chronic kidney disease (CKD). Calculated eGFR, using the MRDR Study equation, is useful in  patients with stable renal function. The eGFR calculation will not be reliable in acutely ill patients when serum creatinine is changing rapidly. It is not useful in patients on dialysis. The eGFR calculation may not be applicable to patients at the low and high extremes of body sizes, pregnant women, and vetetarians.)  Anion Gap 8  Cardiac:  16-Oct-15 11:14   CK, Total  461 (39-308 NOTE: NEW REFERENCE RANGE  03/20/2013)  CPK-MB, Serum  4.1 (Result(s) reported on 01 Dec 2013 at 11:58AM.)  Troponin I  0.22 (0.00-0.05 0.05 ng/mL or less: NEGATIVE  Repeat testing in 3-6 hrs  if clinically indicated. >0.05 ng/mL: POTENTIAL  MYOCARDIAL INJURY. Repeat  testing in 3-6 hrs if  clinically indicated. NOTE: An increase or decrease  of 30% or more on serial  testing suggests a  clinically important change)  Routine Coag:  16-Oct-15 11:14    Prothrombin  46.9  INR  5.3 (INR reference interval applies to patients on anticoagulant therapy. A single INR therapeutic range for coumarins is not optimal for all indications; however, the suggested range for most indications is 2.0 - 3.0. Exceptions to the INR Reference Range may include: Prosthetic heart valves, acute myocardial infarction, prevention of myocardial infarction, and combinations of aspirin and anticoagulant. The need for a higher or lower target INR must be assessed individually. Reference: The Pharmacology and Management of the Vitamin K  antagonists: the seventh ACCP Conference on Antithrombotic and Thrombolytic Therapy. CZOXWR.6045Sept:126 (3suppl): [id number] A HCT value >55% may artifactually increase the PT.  In one study,  the increase was an average of 25%. Reference:  "Effect on Routine and Special Coagulation Testing Values of Citrate Anticoagulant Adjustment in Patients with High HCT Values." American Journal of Clinical Pathology 2006;126:400-405.)  Routine Hem:  16-Oct-15 11:14   WBC (CBC) 10.6  RBC (CBC) 5.41  Hemoglobin (CBC) 14.7  Hematocrit (CBC) 46.9  Platelet Count (CBC)  129 (Result(s) reported on 01 Dec 2013 at 11:41AM.)  MCV 87  MCH 27.1  MCHC  31.3  RDW  17.8   Radiology Results: XRay:    16-Oct-15 11:38, Chest Portable Single View  Chest Portable Single View   REASON FOR EXAM:    Shortness of Breath  COMMENTS:       PROCEDURE: DXR - DXR PORTABLE CHEST SINGLE VIEW  - Dec 01 2013 11:38AM     CLINICAL DATA:  Shortness of breath and respiratory distress with  decreased oxygen saturation and diminished lung sounds. History of  COPD.    EXAM:  PORTABLE CHEST - 1 VIEW    COMPARISON:  04/26/2011    FINDINGS:  Evaluation is mildly limited by motion patient body habitus. Cardiac  silhouette appears upper limits of normal to mildly enlarged in  size. There is mild pulmonary vascular congestion with diffusely  increased interstitial  densities bilaterally, mildly greater than on  the prior study. No segmental airspace consolidation, definite  pleural effusion, or pneumothorax is identified. No acute osseous  abnormality is identified.     IMPRESSION:  Bilateral interstitial opacities, mildly increased from the prior  study and which may reflect interstitial  edema or infectious  pneumonitis.      Electronically Signed    By: Logan Bores    On: 12/01/2013 12:01         Verified By: Ferol Luz, M.D.,  [place]:    16-Oct-15 15:13, US Kidney Bilateral  US Kidney Bilateral   REASON FOR EXAM:    ARF  COMMENTS:       PROCEDURE: [place]  - US KIDNEY  - Dec 01 2013  3:13PM     CLINICAL DATA:  Acute renal failure.    EXAM:  RENAL/URINARY TRACT ULTRASOUND COMPLETE    COMPARISON:  None.    FINDINGS:  Right Kidney:  Length: 13.9 cm. Echogenicity within normal limits. No mass or  hydronephrosis visualized.    Left Kidney:    Length: 11.1 cm. Echogenicity within normal limits. No mass or  hydronephrosis visualized.    Bladder:    Appears normal for degree of bladder distention.     IMPRESSION:  Normal renal ultrasound.  Electronically Signed    By: Sabino Dick M.D.    On: 12/01/2013 16:07         Verified By: Marveen Reeks, M.D.,    rivaroxaban: Swelling  Ace Inhibitors: Other  Vital Signs/Nurse's Notes: **Vital Signs.:   16-Oct-15 13:21  Vital Signs Type Admission  Temperature Temperature (F) 98.2  Celsius 36.7  Temperature Source axillary  Pulse Pulse 93  Respirations Respirations 15  Systolic BP Systolic BP 540  Diastolic BP (mmHg) Diastolic BP (mmHg) 90  Mean BP 108  Pulse Ox % Pulse Ox % 96  Pulse Ox Activity Level  At rest  Oxygen Delivery Non-invasive ventilation (CPAP/BIPAP)    Impression 49 year old obese male with a history of COPD, on home oxygen, CPAP for OSA, morbid obesity, diastolic CHF, chronic atrial fibrillation on Coumadin, smoking, brought in from home for acute  respiratory failure, hypoxia, mental status changes.  1.  Acute hypoxic hypercarbic respiratory failure  stable on BiPAP.  Started on IV Lasix,  nebulizer treatment, Solu-Medrol and inhalers.  Started Rocephin and azithromycin empirically.   echocardiogram pending ---fluid status unclear in setting of renal failure. ECHO may help estimated RVSP. He are assuming he has diastolic CHF/COPD exacerbation leading to respiratory distress  2.  Acute renal failure with hypokalemia.  Etiology unclear. Possible ATN, unable to exclude prerenal state or chronic disease Holding nephrotoxics, metformin and losartan   Lasix started, will need to m onitor renal function closely.  Nephrology consulted,  renal ultrasound normal Baseline creatinine in 2013 was normal  3.  Elevated LFTs.  etiology unclear, wife reported he is taking high dose frequent pain pills (tylenol based?) acute or chornic. Would monitor INR elevated  4.  Atrial fibrillation.  Hold Coumadin, as INR is high.  Continue Cardizem p.o.   5.  Elevated troponin, likely from demand ischemia.  Re cycle troponins and monitor. hold off on any IV heparin at this point as patients INR >5  6.  Tobacco use disorder.  may need patch later  7.  Deep vein thrombosis prophylaxis,  on Coumadin.   Electronic Signatures: Rise Mu (PA-C)  (Signed 16-Oct-15 16:11)  Authored: General Aspect/Present Illness, Home Medications, Allergies Ida Rogue (MD)  (Signed 16-Oct-15 17:42)  Authored: General Aspect/Present Illness, History and Physical Exam, Review of System, Past Medical History, Health Issues, Home Medications, Labs, Radiology, Vital Signs/Nurse's Notes, Impression/Plan  Co-Signer: General Aspect/Present Illness, Home Medications, Allergies   Last Updated: 16-Oct-15 17:42 by Ida Rogue (  MD) 

## 2014-06-10 NOTE — Consult Note (Signed)
General Aspect Cardiology Consult  Primary Care:  [facility] Cardiology:  None  Chief Complaint:  SOB    Present Illness The patient reports a long history of atrial fibrillation.  He denies any history of MI.  No recent echo.  Reports stress testing years ago.  He does not follow with a cardiologist.  He has followed at [facility] and was on warfarin in the past but the INR could not be regulated.  He had swelling with Pradaxa.  He has long stanging chronic lung disease and follows with a pulmonolgist for COPD and sleep apnea.    He presents now for increased dypsnea and increased heart rate.  He reports increased dyspnea x 4 days particularly bad today.  He describes PND and orthopnea.  He doesn't know his weight but has had no swelling.  As his breathing got worse his heart rate increased.  He has had chest burning which he has not had in the past.  He denies neck or arm pain.  He had no N/V but he did have some diaphoresis.  In the ER he has been in atrial fib with a rapid rate.  However, cardiac markers are negative initially.  BNP is elevated.  Edema on CXR  FAMILY HISTORY:  Father with CVA, Mother with stents in her 85s  SOCIAL HISTORY:  Postive 1ppd x 30 years, disabled, no ETOH   Physical Exam:   GEN WD, NAD, obese    HEENT PERRL, hearing intact to voice    NECK supple  No masses  thyroid tender    RESP normal resp effort  clear BS    CARD Irregular rate and rhythm  Normal, S1, S2  S3  No murmur    ABD denies tenderness  denies Flank Tenderness  no liver/spleen enlargement  normal BS  Obese    LYMPH negative neck    EXTR negative cyanosis/clubbing, positive edema, Chronic venous stasis changes.  Mild edema.    NEURO cranial nerves intact, motor/sensory function intact    PSYCH alert, A+O to time, place, person   Review of Systems:   Subjective/Chief Complaint Postive for diarrhea and as stated in the HPI.  Otherwise negative for all other systems.     GERD - Esophageal Reflux:     Atrial Fibrillation:    Hypertension:    Asthma:    COPD:    Diabetes Mellitus, Type II (NIDD):   Home Medications: Medication Instructions Status  advair 500/50 1 puff bid 1 puff(s)  2 times a day  Active  albuterol 90 mcg/inh inhalation aerosol 1  inhaled  PRN   Active  gabapentin 300 mg oral capsule 2  orally 4 times a day Active  metformin 500 mg oral tablet 2  orally 2 times a day  Active  losartan 50 mg oral tablet 1 tab(s) orally once a day Active  tramadol 50 mg oral tablet 2 tab(s) orally 3 times a day Active  Tylenol Caplet Extra Strength 500 mg oral tablet 1 tab(s) orally 3 times a day Active  Colace 100 mg oral capsule 1 cap(s) orally 2 times a day Active  buPROPion 150 mg/12 hours (SR) oral tablet, extended release 1 tab(s) orally 2 times a day Active  busPIRone 15 mg oral tablet 1 tab(s) orally 3 times a day Active  citalopram 40 mg oral tablet 1 tab(s) orally once a day Active  Nexium 40 mg oral delayed release capsule 1 cap(s) orally once a day Active  simvastatin  80 mg oral tablet 1 tab(s) orally once a day Active  Allergy Relief (diphenhydramine HCl) 25 mg oral tablet 1 tab(s) orally 3 times a day Active  Morphine IR 1 tab(s) orally 3 times a day Active  diltiazem 24HR ER $Remov'240MG'XWSMHD$  2   once a day Active   Routine Hem:  10-Mar-13 08:18    WBC (CBC) 12.0   Hemoglobin (CBC) 17.8   Hematocrit (CBC) 54.3   Platelet Count (CBC) 164  Routine Chem:  10-Mar-13 08:18    Sodium, Serum 142   Potassium, Serum 3.7   eGFR (African American) >60  Cardiac:  10-Mar-13 08:18    Troponin I < 0.02  Thyroid:  10-Mar-13 08:18    Thyroid Stimulating Hormone 2.49  Routine Chem:  10-Mar-13 08:18    B-Type Natriuretic Peptide [facility]) 262  Routine Coag:  10-Mar-13 08:18    D-Dimer, Quantitative 0.26   EKG:   EKG Interp. by me  Atrial fibrillation    Interpretation Low voltage in the limb leads.  Poor anterior R wave progression.  No acute ST T wave changes.    Radiology Results: XRay:    10-Mar-13 08:43, Chest Portable Single View   Chest Portable Single View    REASON FOR EXAM:    shortness of breath  COMMENTS:       PROCEDURE: DXR - DXR PORTABLE CHEST SINGLE VIEW  - Apr 26 2011  8:43AM     RESULT:     Findings: There is thickening of the interstitial markings and   peribronchial cuffing. No focal regions of consolidation are appreciated.   The cardiac silhouette is enlarged indicative of cardiomegaly. The   visualized bony skeleton is unremarkable.    IMPRESSION:  Interstitial infiltrate likely representing pulmonary edema.   No focal regions of consolidation appreciated.    Thank you for the opportunity to contribute to the care of your patient.           Verified By: Mikki Santee, M.D., MD    Ace Inhibitors: Other    Plan 1)  Atrial fibrillation:  I agree with the plans to add beta blocker.  Check echocardiogram.  I see no contraindication to Xarelto which can be started prior to discharge.  GFR is great than 60 so no dose adjustment will be needed. (20 mg daily)  ASA should be stopped.  Rate control can be evaluated with ambulation before discharge and with Holter as an outpatient.  2)  Chest pain:  No objective evidence of ischemia.  However, he has high risk.  Cylce enzymes and I would suggest we consider an outpatient stress test if enzymes negative.  If he has reasonable echo windows we could consider a stress echo.  3)  Tobacco:  Educated.  4)  Diabetes:  Per primary team.   Electronic Signatures: Minus Breeding (MD)  (Signed 10-Mar-13 14:35)  Authored: General Aspect/Present Illness, History and Physical Exam, Review of System, Past Medical History, Home Medications, Labs, EKG , Radiology, Allergies, Impression/Plan   Last Updated: 10-Mar-13 14:35 by Minus Breeding (MD)

## 2014-06-10 NOTE — H&P (Signed)
PATIENT NAME:  Javier Brooks, Javier Brooks MR#:  [id number] DATE OF BIRTH:  1965-09-02  DATE OF ADMISSION:  04/26/2011  REFERRING PHYSICIAN: ER physician, Dr. Darnelle Catalan PRIMARY CARE PHYSICIAN: At [facility]   CHIEF COMPLAINT: Shortness of breath, palpitations.   HISTORY OF PRESENT ILLNESS: Patient is a 49 year old male with past medical history of sleep apnea on CPAP and oxygen 2.5 liters at bedtime, chronic obstructive pulmonary disease, ongoing smoking, diabetes, hypertension, hyperlipidemia, atrial fibrillation who reports that he has not been feeling well for the past 2 to 3 days. Patient has been having some additional stress in the last 2 to 3 days and has been feeling anxious and depressed. He takes Xanax 2 mg b.i.d. p.r.n., however, he ran out of it. He felt an anxiety attack coming on which precipitated shortness of breath, lower chest pain and palpitations. He has been feeling his heart racing for the past 2 to 3 days. He gets short of breath, palpitations even at rest. Patient reports no new leg swelling, weight gain or pain in his legs. Denies any fever, cough, nausea, vomiting, abdominal pain. He describes lower chest wall/retrosternal pain which is described as burning. He reports it hurts to take a deep breath. Patient reports that he has had similar symptoms in the past and has been admitted to [facility]. The last admission was more than a year ago. Since his shortness of breath started he has been taking a steroid taper. Patient reports that he taking a full dose aspirin for his atrial fibrillation. He was on Coumadin, however, his INR was very difficult to regulate therefore his primary care physician took him off it. He tried Pradaxa but that resulted in severe lower extremity swelling and fluid retention and he stopped taking that.   PAST MEDICAL HISTORY: 1. Morbid obesity.  2. Sleep apnea on CPAP at night in addition to oxygen 2.5 liters. 3. Chronic obstructive pulmonary  disease. 4. Ongoing smoking. 5. Diabetes. 6. Hypertension. 7. Hyperlipidemia. 8. Atrial fibrillation. 9. Anxiety, depression. 10. Chronic pain syndrome with chronic pain in the back, legs and shoulders.  11. 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   ALLERGIES: ACE inhibitors.   CURRENT MEDICATIONS: 1. Prednisone taper.  2. Multivitamin tablet 1 tablet daily.  3. Lidoderm patch daily.  4. Advair 500/50, 1 puff b.i.d.  5. Albuterol metered dose inhaler p.r.n.  6. Neurontin 600 mg q.i.d.  7. Metformin 1000 mg b.i.d.  8. Losartan 50 mg daily.  9. Tramadol 50 mg 2 tablets t.i.d.  10. Tylenol extra strength 500 mg t.i.d.  11. Colace 100 mg b.i.d.  12. Bupropion 150 mg b.i.d.  13. Buspirone 15 mg t.i.d.  14. Citalopram 40 mg daily.  15. Nexium 40 mg daily.  16. Simvastatin 80 mg daily.  17. Benadryl 25 mg t.i.d. p.r.n.  18. Morphine immediate release 15 mg t.i.d.  19. Cardizem 240 mg b.i.d.   SOCIAL HISTORY: Patient is disabled. Lives with his wife. Smokes 1 pack per day. Denies alcohol or drug abuse.   FAMILY HISTORY: Both parents had diabetes, heart disease. Father had a stroke.   REVIEW OF SYSTEMS: CONSTITUTIONAL: Patient reports fatigue and weakness. Denies any weight gain. EYES: Denies any blurred or double vision. ENT: Denies any tinnitus, ear pain. RESPIRATORY: Reports dyspnea, painful respirations. CARDIOVASCULAR: Reports palpitations and pleuritic-type chest pain. Denies any syncope. GASTROINTESTINAL: Denies any nausea, vomiting, diarrhea, abdominal pain. GENITOURINARY: Denies any dysuria, hematuria. ENDOCRINE: Denies any polyuria, nocturia. HEME/LYMPH: Denies any anemia, easy bruisability.  INTEGUMENT: Denies any acne, rash. Has chronic lower extremity venostasis dermatitis changes with hyperpigmentation. MUSCULOSKELETAL: Has chronic back, leg and shoulder pain. NEUROLOGICAL: Denies any numbness, weakness. PSYCH: Has history of anxiety and depression.   PHYSICAL  EXAMINATION:  VITAL SIGNS: Temperature 98.1, heart rate 117 on minimal exertion, sitting up and standing heart rate goes up into the 130s, respiratory rate 28, blood pressure 127/70, pulse oximetry 95%.   GENERAL: Patient is a morbidly obese male who is sitting propped up in bed. He is diaphoretic, using accessory muscles of respiration, unable to speak in full sentences.   HEAD: Atraumatic, normocephalic.   EYES: There is no pallor, icterus, or cyanosis. Pupils are equal, round, and reactive to light and accommodation. Extraocular movements intact.   ENT: Wet mucous membranes. No oropharyngeal erythema or thrush.   NECK: Supple. No masses. No JVD. No thyromegaly or lymphadenopathy.   CHEST WALL: No tenderness to palpation. No intercostal muscle retractions. Patient is using accessory muscles of respiration.   LUNGS: Bilateral basal crepitations with bilaterally reduced breath sounds. No wheezing, rhonchi.   CARDIOVASCULAR: S1, S2 regular. No murmur, rubs, or gallops.   ABDOMEN: Soft, nontender, nondistended. No guarding. No rigidity. Normal bowel sounds. Organomegaly was difficult to be appreciated due to body habitus.   SKIN: No rashes or lesions. Patient has chronic lower extremity venostasis dermatitis changes with hyperpigmentation.  PERIPHERIES: There is some pedal edema, 1+ pedal pulses.   MUSCULOSKELETAL: No cyanosis or clubbing.   NEUROLOGIC: Awake, alert, oriented x3. Nonfocal neurological exam.   PSYCH: Appears anxious.   LABORATORY, DIAGNOSTIC AND RADIOLOGICAL DATA: Urine drug screen is positive for tricyclic antidepressants and opioids. ABG: pH 7.46, pCO2 39, pO2 51, bicarbonate 27.7, lactic acid 2.3. Chest x-ray shows interstitial edema with no focal consolidation. White count 12, hemoglobin 17.8, hematocrit 54.3, platelets 164, glucose 174. Normal renal function. Normal electrolytes. Normal CO2. AST 42. Cardiac enzymes negative. BNP 262. TSH 2.49. D-dimer 0.26.    ASSESSMENT AND PLAN: 49 year old male with history of sleep apnea, diabetes, atrial fibrillation, anxiety, depression presents with shortness of breath and some chest pain, pleuritic type, has had recent stressors and had ran out of his p.r.n. Xanax.  1. Acute hypoxic respiratory failure, likely multifactorial. Differential diagnoses includes chronic obstructive pulmonary disease exacerbation, anxiety, possible underlying congestive heart failure and uncontrolled atrial fibrillation. A pulmonary embolism would also have been in the differential diagnosis, however, patient's d-dimer is normal. He reports no increase in pain or swelling in his legs. Will provide patient with supplemental oxygen and use BiPAP as needed.  2. Atrial fibrillation with RVR. Patient has uncontrolled atrial fibrillation with increasing heart rate on minimal exertion. His TSH is normal. Will continue his Cardizem and add Lopressor. Will give one dose of digoxin. Will add digoxin. Patient was on Coumadin at one point, however, that was stopped because his INR was difficult to regulate. He has tried Pradaxa but developed severe leg swelling so he discontinued that. Will continue full dose aspirin and get a 2-D echo and a cardiology consult.  3. Chest pain, possibly due to anxiety/uncontrolled atrial fibrillation/shortness of breath. Patient's first set of cardiac enzymes is negative, will check two more sets. Continue on aspirin and beta blocker.  4. Acute chronic obstructive pulmonary disease exacerbation. Will treat patient with Advair, nebulizer treatment and steroids. Will hold off antibiotics since there are no clinical signs of infection.   5. Smoking. Patient was counseled about cessation for more than three minutes and has been provided  with a nicotine patch.  6. Diabetes. Will check a hemoglobin A1c. Place on an ADA diet. Insulin sliding scale. Continue metformin. Expect some hyperglycemia due to concurrent use of steroids.   7. Elevated AST. Patient is morbidly obese and there is a possibility that he may be having hepatic steatosis.  8. Leukocytosis possibly due to steroids/stress reaction. Patient reports that he started taking the prednisone taper but 2 to 3 days ago.  9. Will place on GI and DVT prophylaxis.   Reviewed old medical records, discussed with ED physician, discussed with the patient and his wife the plan of care and management.   TIME SPENT: 75 minutes.   ____________________________ Darrick MeigsSangeeta Trudie Cervantes, MD sp:cms D: 04/26/2011 12:31:19 ET T: 04/26/2011 13:27:43 ET JOB#: [id number]  cc: Darrick MeigsSangeeta Kanyon Bunn, MD, <Dictator> [facility] [place] [place] Richa Shor MD ELECTRONICALLY SIGNED 04/27/2011 15:22

## 2014-06-10 NOTE — Discharge Summary (Signed)
PATIENT NAME:  Javier Brooks, Javier Brooks MR#:  [id number] DATE OF BIRTH:  [birth year]  DATE OF ADMISSION:  04/26/2011 DATE OF DISCHARGE:  04/27/2011  DISCHARGE DIAGNOSES:  1. Chronic obstructive pulmonary disease exacerbation, atrial fibrillation with rapid ventricular response.  2. Morbid obesity.  3. Sleep apnea, on CPAP at night.  4. Chronic obstructive pulmonary disease.  5. Ongoing tobacco abuse. 6. Diabetes. 7. Hypertension. 8. Hyperlipidemia. 9. Anxiety and depression. 10. Chronic pain syndrome with pain in the back, legs, and shoulders. 11. History of angioedema with ACE inhibitors.   DISCHARGE MEDICATIONS:  1. Lidoderm patch daily.  2. Advair Diskus 500/50 one puff twice a day. 3. Albuterol inhaler as needed. 4. Neurontin 600 mg four times daily. 5. Metformin 1 gram p.o. twice a day. 6. Losartan 50 mg daily. 7. Tramadol 50 mg two tablets three times daily. 8. Colace 100 mg p.o. twice a day. 9. Tylenol Extra-Strength 500 mg three times daily. 10. Bupropion 150 mg p.o. twice a day. 11. Buspirone 50 mg p.o. three times daily. 12. Citalopram 40 mg p.o. daily. 13. Nexium 40 mg p.o. daily.  14. Simvastatin 80 mg p.o. daily.  15. Benadryl 25 mg p.o. three times daily p.r.n.  16. Morphine immediate 15 mg three times daily. 17. Cardizem 240 mg p.o. twice a day.  NEW MEDICATIONS: 1. Digoxin 0.125 mg p.o. daily. 2. Xarelto 20 mg p.o. daily.  3. Prednisone dose taper 40 mg daily for two days, 30 mg p.o. daily for two days, 20 mg p.o. daily for two days, and then 10 mg p.o. daily for two days.   CONSULTATION: Julien Nordmann, MD - Cardiology.  PRIMARY CARE PHYSICIAN: [facility].  DISCHARGE FOLLOWUP: The patient will followup with Dr. Dossie Arbour in 1 to 2 weeks for a stress test.   HOSPITAL COURSE:  1. The patient is a 49 year old male patient with history of hypertension, diabetes, and chronic atrial fibrillation who came in because of trouble breathing and palpitations. Please  look at the history and physical for full details. His heart rate was at 117 on admission. He received IV digoxin, one dose, and then started on 0.125 mg of digoxin and was continued on his metoprolol and Cardizem. The patient was seen by Dr. Dossie Arbour and started on Xarelto for anticoagulation. The patient was on Pradaxa, but it was stopped because leg swelling before and he also was taking Coumadin. That was also stopped because of difficulty continuing to check INR. Today we started him on Xarelto and the cardiologist, Dr. Mariah Milling, spoke to the patient. He is on Xarelto and he will be going home with Xarelto 20 mg p.o. daily.  2. Chronic obstructive pulmonary disease exacerbation. His lungs had some wheeze yesterday. Oxygen saturations were around 95% on room air. His chest x-ray showed mild pulmonary edema, but no infiltrate. When he came in, he was diaphoretic and using accessory muscles for respiration and unable to speak full sentences. Her lungs also had some crepitations with decreased breath sounds. He received also IV Lasix along with steroids and lung sounds are better today with no wheezing or rales. Echocardiogram showed an ejection fraction of 50 to 55% with low normal LV function. Dr. Juliann Pares stated this is because of obesity. The patient says that he feels better and he is not using accessory muscles. The patient is able to ambulate in the room.  3. Acute on chronic respiratory failure secondary to chronic obstructive pulmonary disease flare, which is better. He will be going  home with his medications which are Advair along with his inhalers and also a dose of prednisone tapering, and he can continue his oxygen.  4. Regarding his diabetes, he can continue his metformin. 5. Chronic atrial fibrillation. Rate is controlled this morning at a rate of around 61. He can continue Xarelto, metoprolol, and Cardizem.   DISCHARGE CONDITION: Stable.   TIME SPENT ON DISCHARGE PREPARATION: More than 30  minutes.   [facility] and also Dr. Dossie Arbourim Gollan in 1 to 2 weeks for a stress test. ____________________________ Katha HammingSnehalatha Enio Hornback, MD sk:slb D: 04/27/2011 10:53:33 ET T: 04/27/2011 11:47:59 ET JOB#: [id number]  cc: Katha HammingSnehalatha Tola Meas, MD, <Dictator> Antonieta Ibaimothy J. Gollan, MD Katha HammingSNEHALATHA Seabron Iannello MD ELECTRONICALLY SIGNED 04/30/2011 22:19

## 2015-06-14 IMAGING — US US RENAL KIDNEY
1 series · 14 of 25 positions shown · non-contrast
Comparison: None.

CLINICAL DATA: Acute renal failure.

EXAM:
RENAL/URINARY TRACT ULTRASOUND COMPLETE

[Series 1: us renal kidney · 0.31mm/px · 14 of 31 slices shown]
[im 1/31]
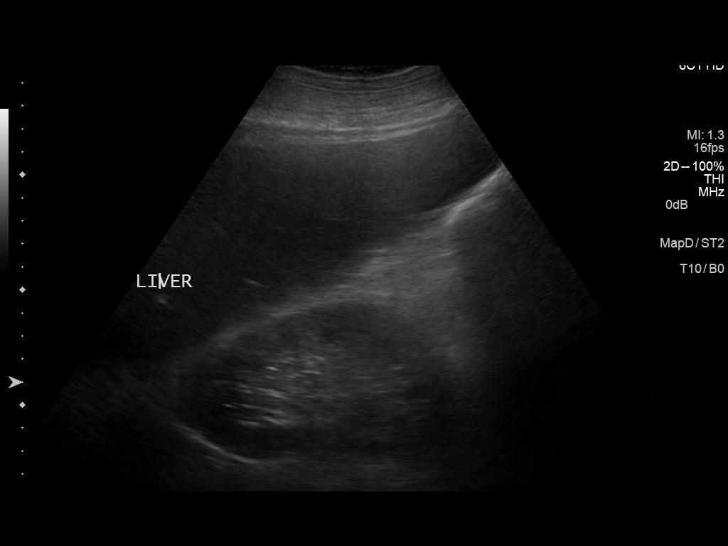
[im 3/31]
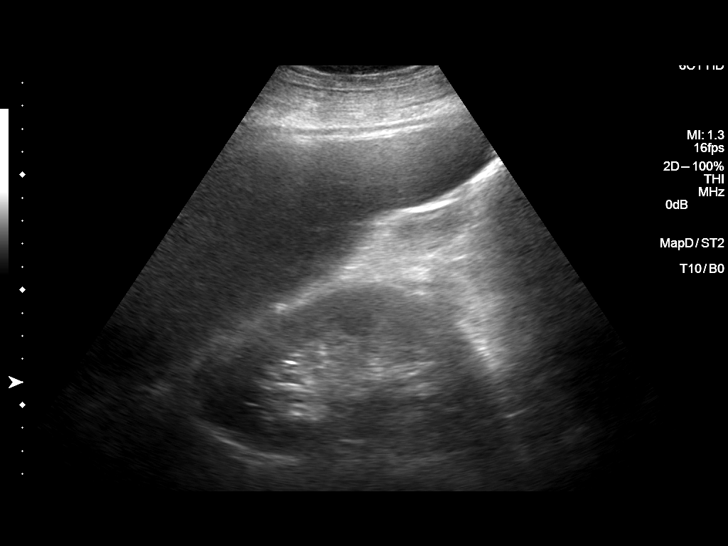
[im 6/31]
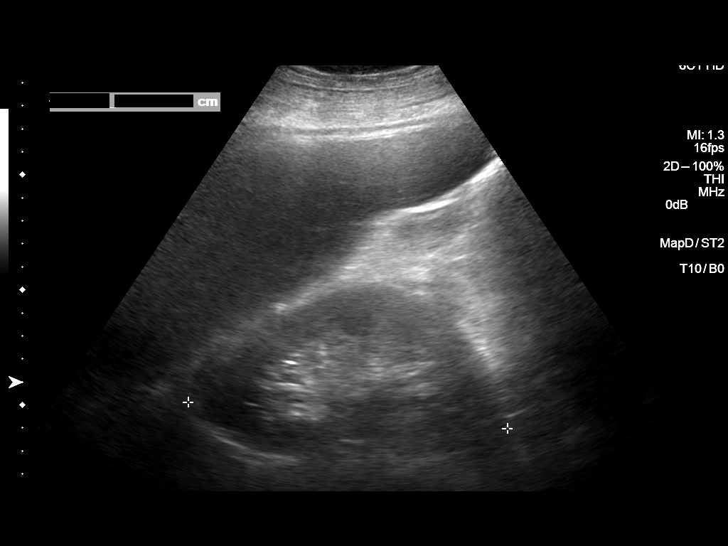
[im 8/31]
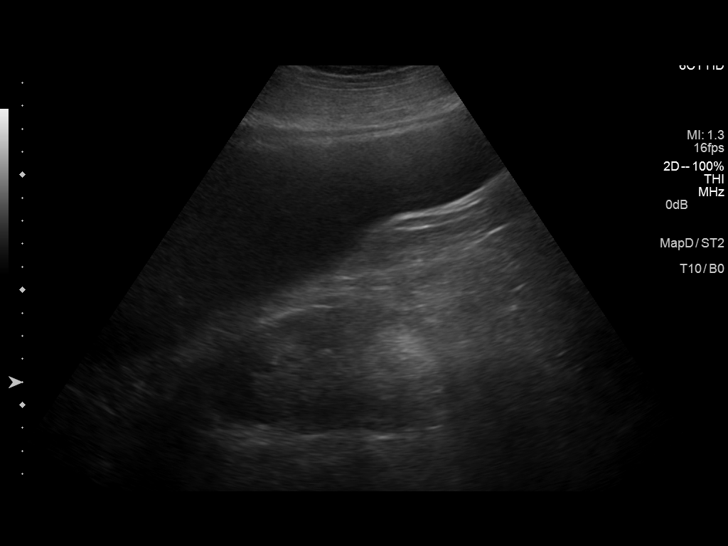
[im 11/31]
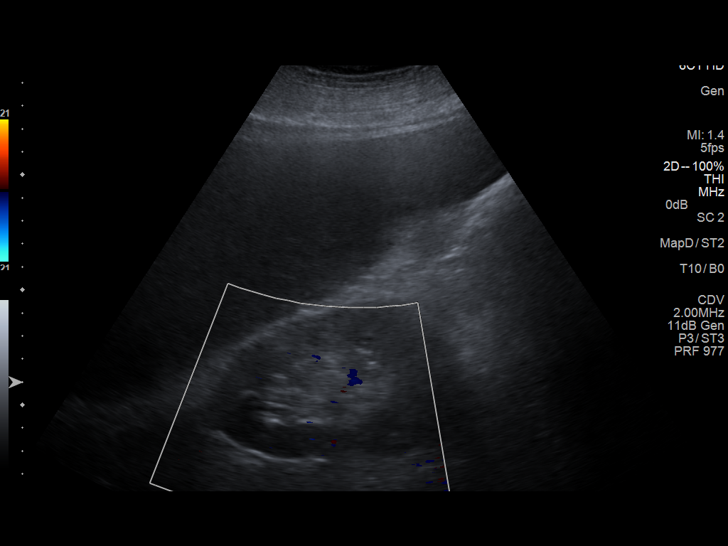
[im 12/31]
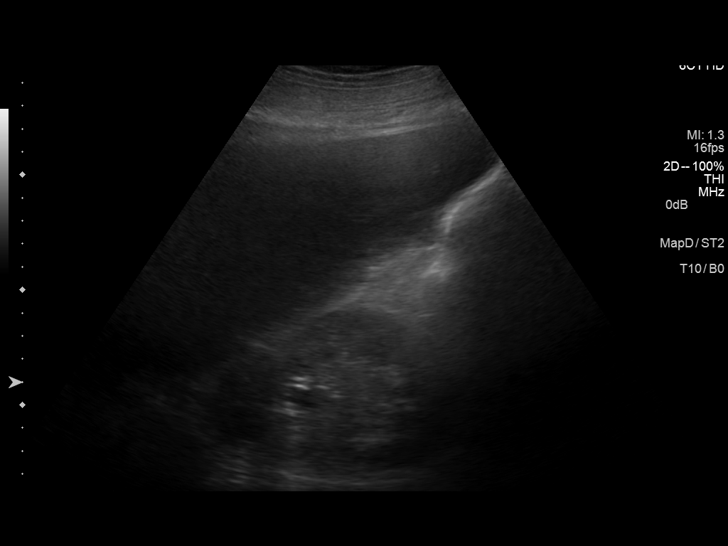
[im 14/31]
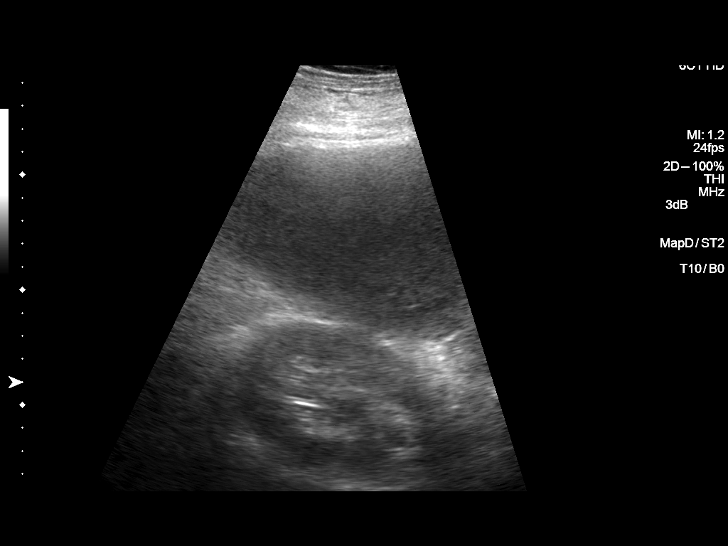
[im 17/31]
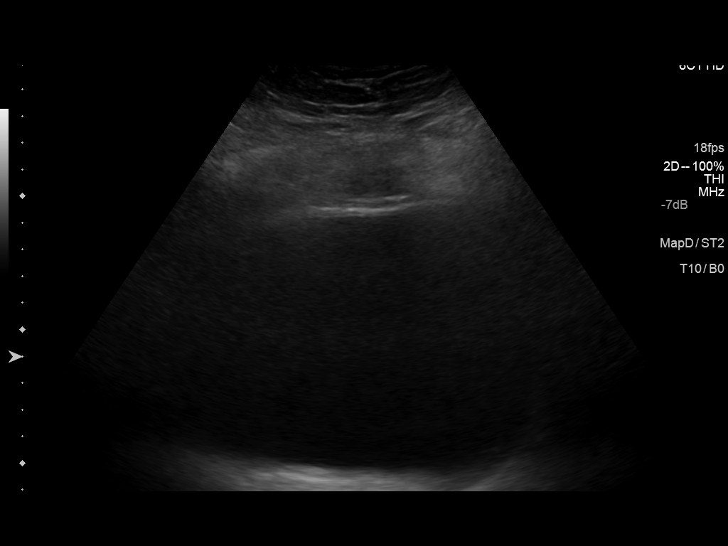
[im 19/31]
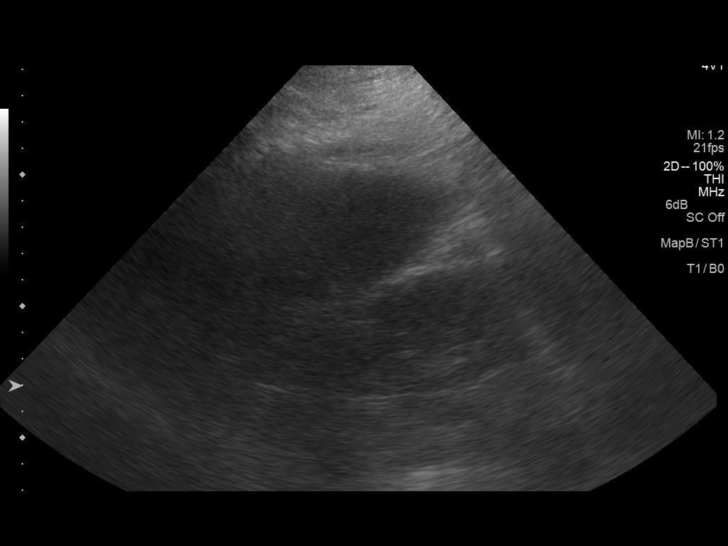
[im 21/31]
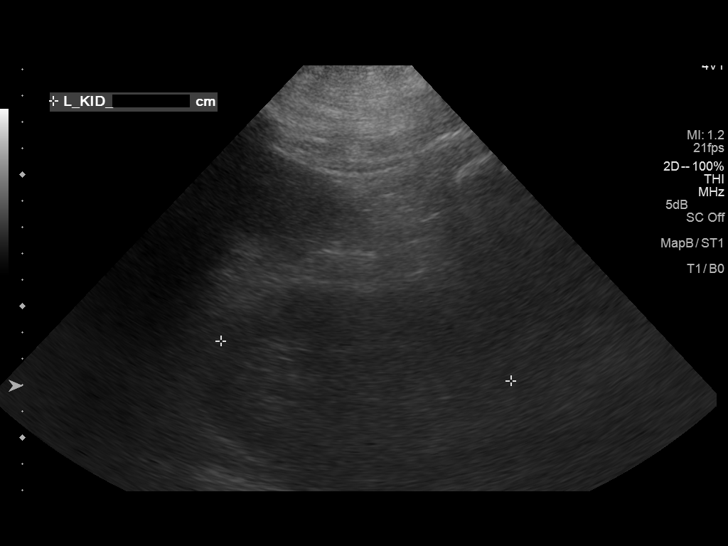
[im 23/31]
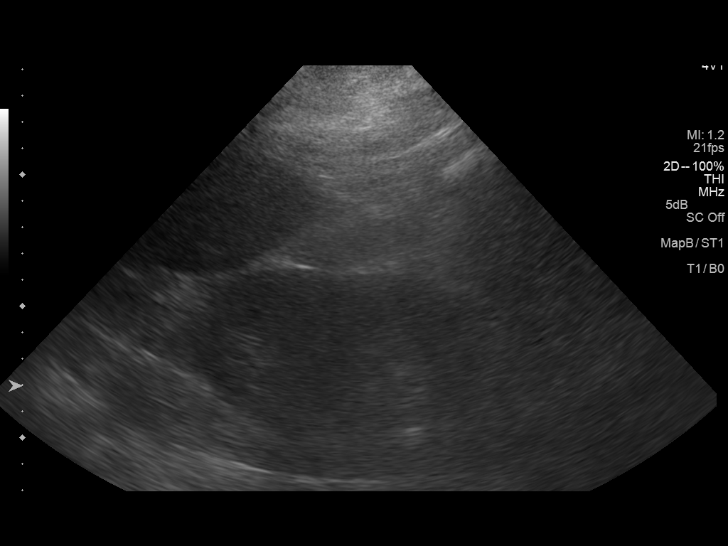
[im 26/31]
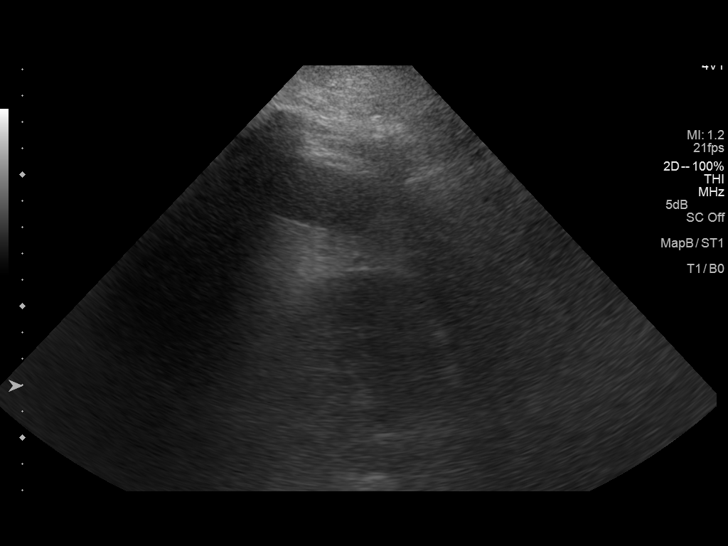
[im 28/31]
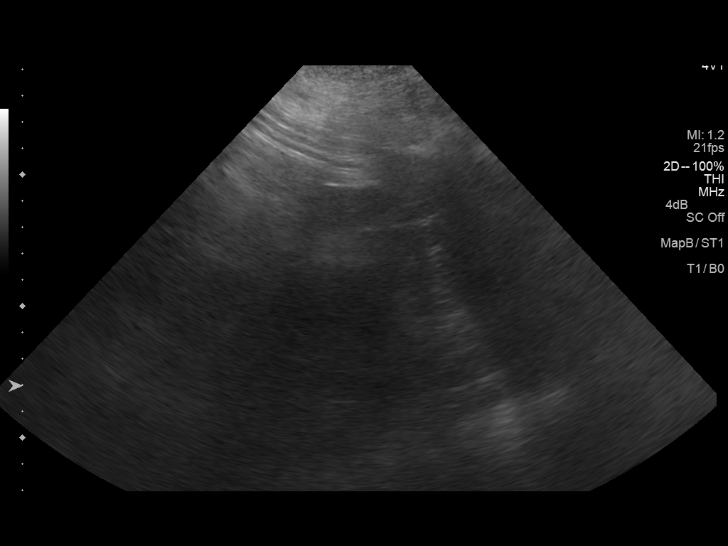
[im 31/31]
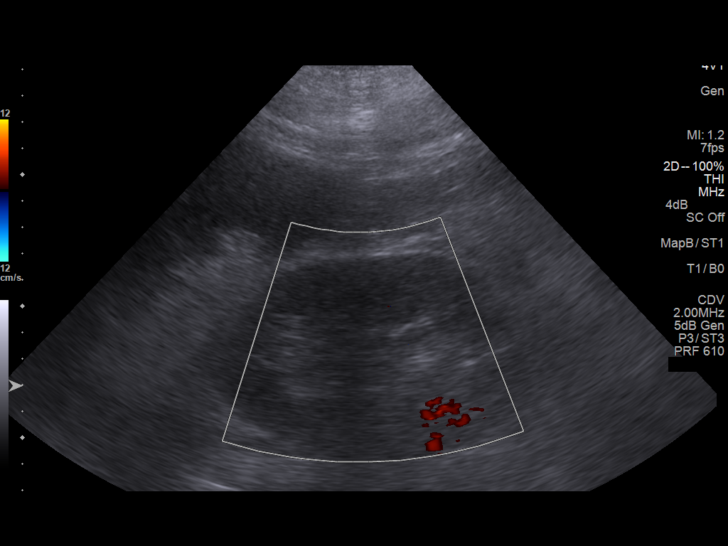

[14 of 25 positions shown; findings below may reference images not displayed]

FINDINGS: Right Kidney:

Length: 13.9 cm. Echogenicity within normal limits. No mass or
hydronephrosis visualized.

Left Kidney:

Length: 11.1 cm. Echogenicity within normal limits. No mass or
hydronephrosis visualized.

Bladder:

Appears normal for degree of bladder distention.
IMPRESSION: Normal renal ultrasound.
